# Patient Record
Sex: Male | Born: 1965 | Race: Black or African American | Hispanic: No | Marital: Single | Smoking: Current every day smoker
Health system: Southern US, Community
[De-identification: ages and names within clinical notes are randomized; demographics above are authoritative.]

---

## 2001-08-29 ENCOUNTER — Emergency Department (HOSPITAL_COMMUNITY): Admission: EM | Admit: 2001-08-29 | Discharge: 2001-08-29 | Payer: Self-pay | Admitting: Emergency Medicine

## 2003-02-11 ENCOUNTER — Emergency Department (HOSPITAL_COMMUNITY): Admission: EM | Admit: 2003-02-11 | Discharge: 2003-02-11 | Payer: Self-pay | Admitting: Emergency Medicine

## 2007-03-03 ENCOUNTER — Emergency Department (HOSPITAL_COMMUNITY): Admission: EM | Admit: 2007-03-03 | Discharge: 2007-03-04 | Payer: Self-pay | Admitting: *Deleted

## 2008-07-29 IMAGING — CR DG CHEST 2V
2 series · 2 of 2 positions shown · non-contrast
Comparison: none

CLINICAL DATA: Chest pain.  Tachycardia and palpitations.  
 CHEST - 2 VIEW:

[w chest pa]
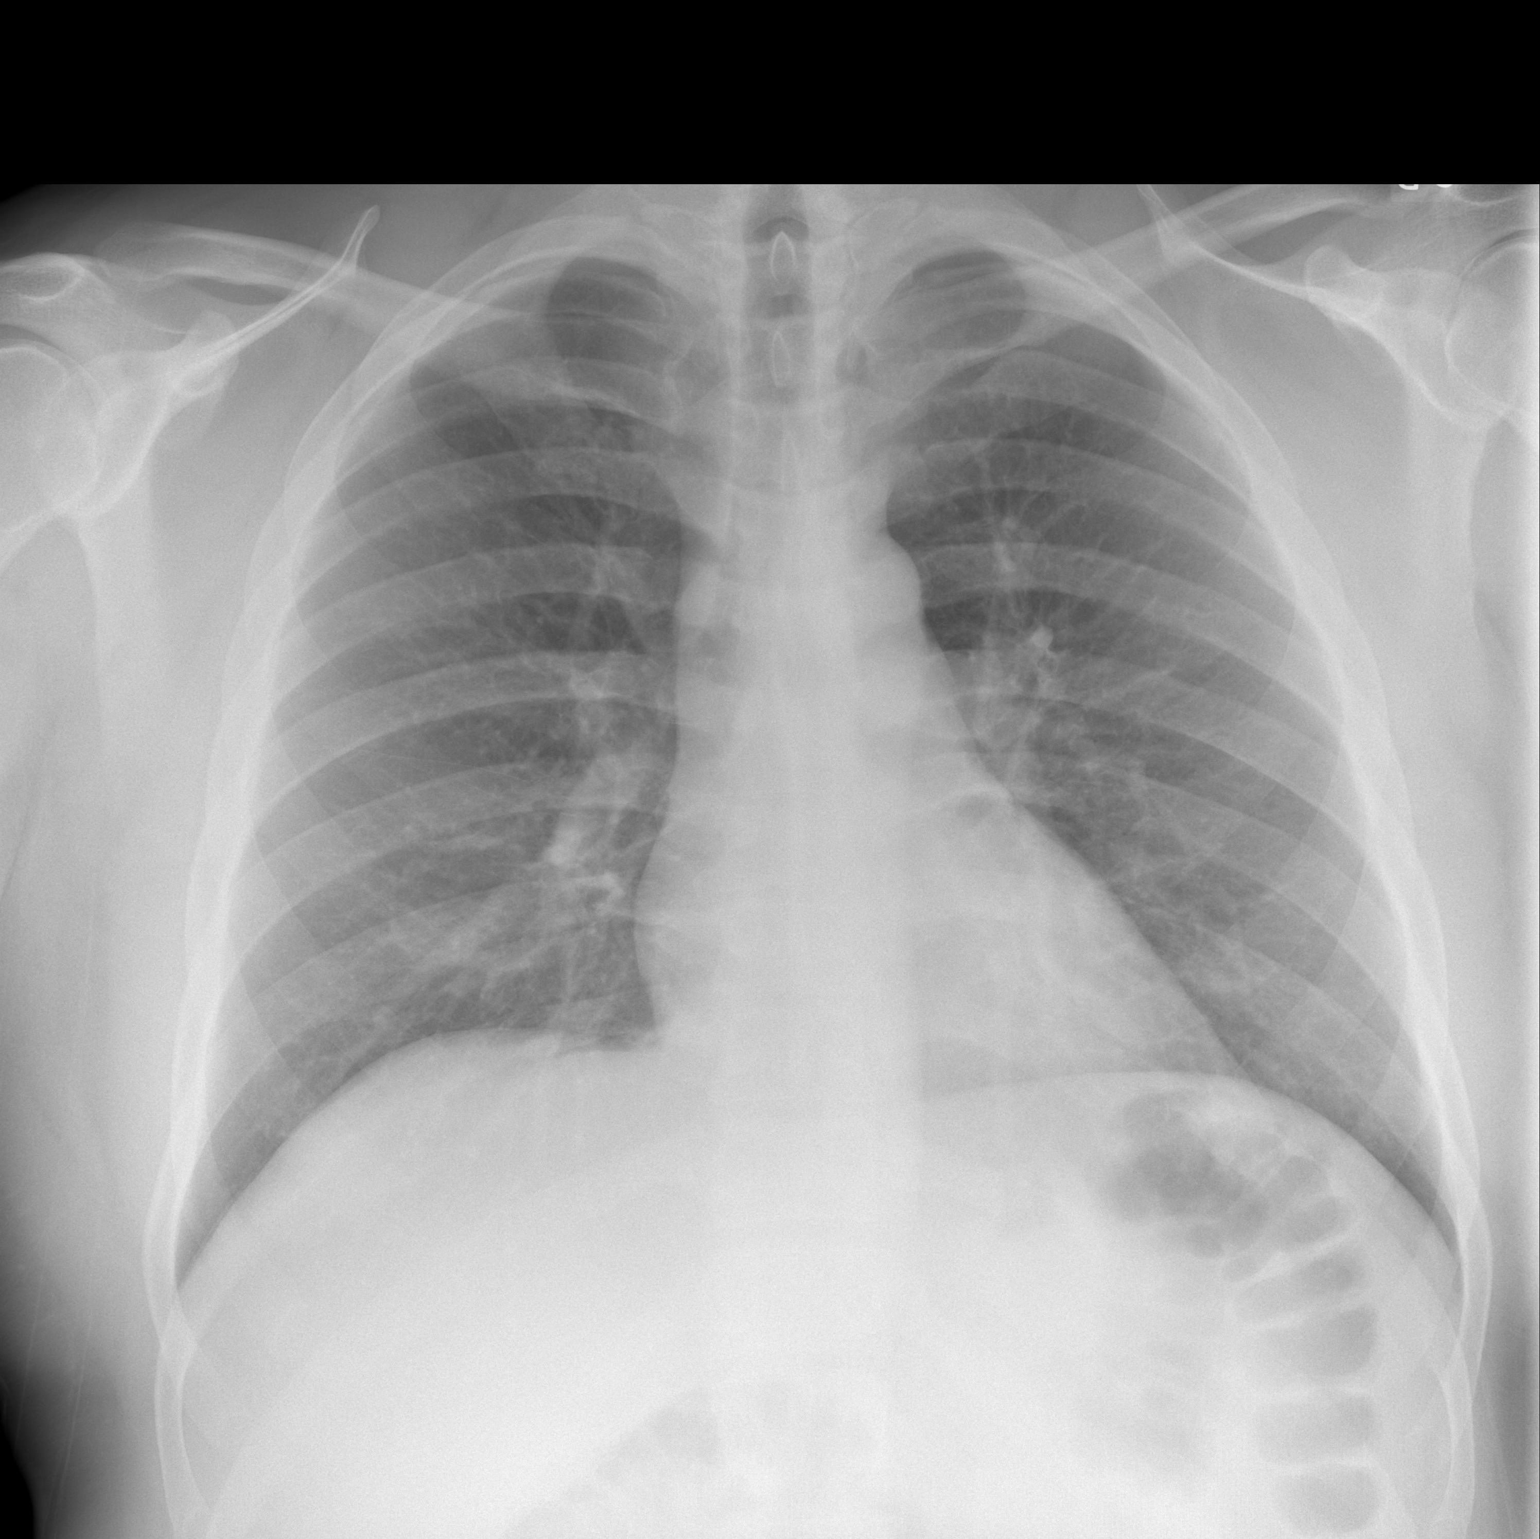

[w chest lat]
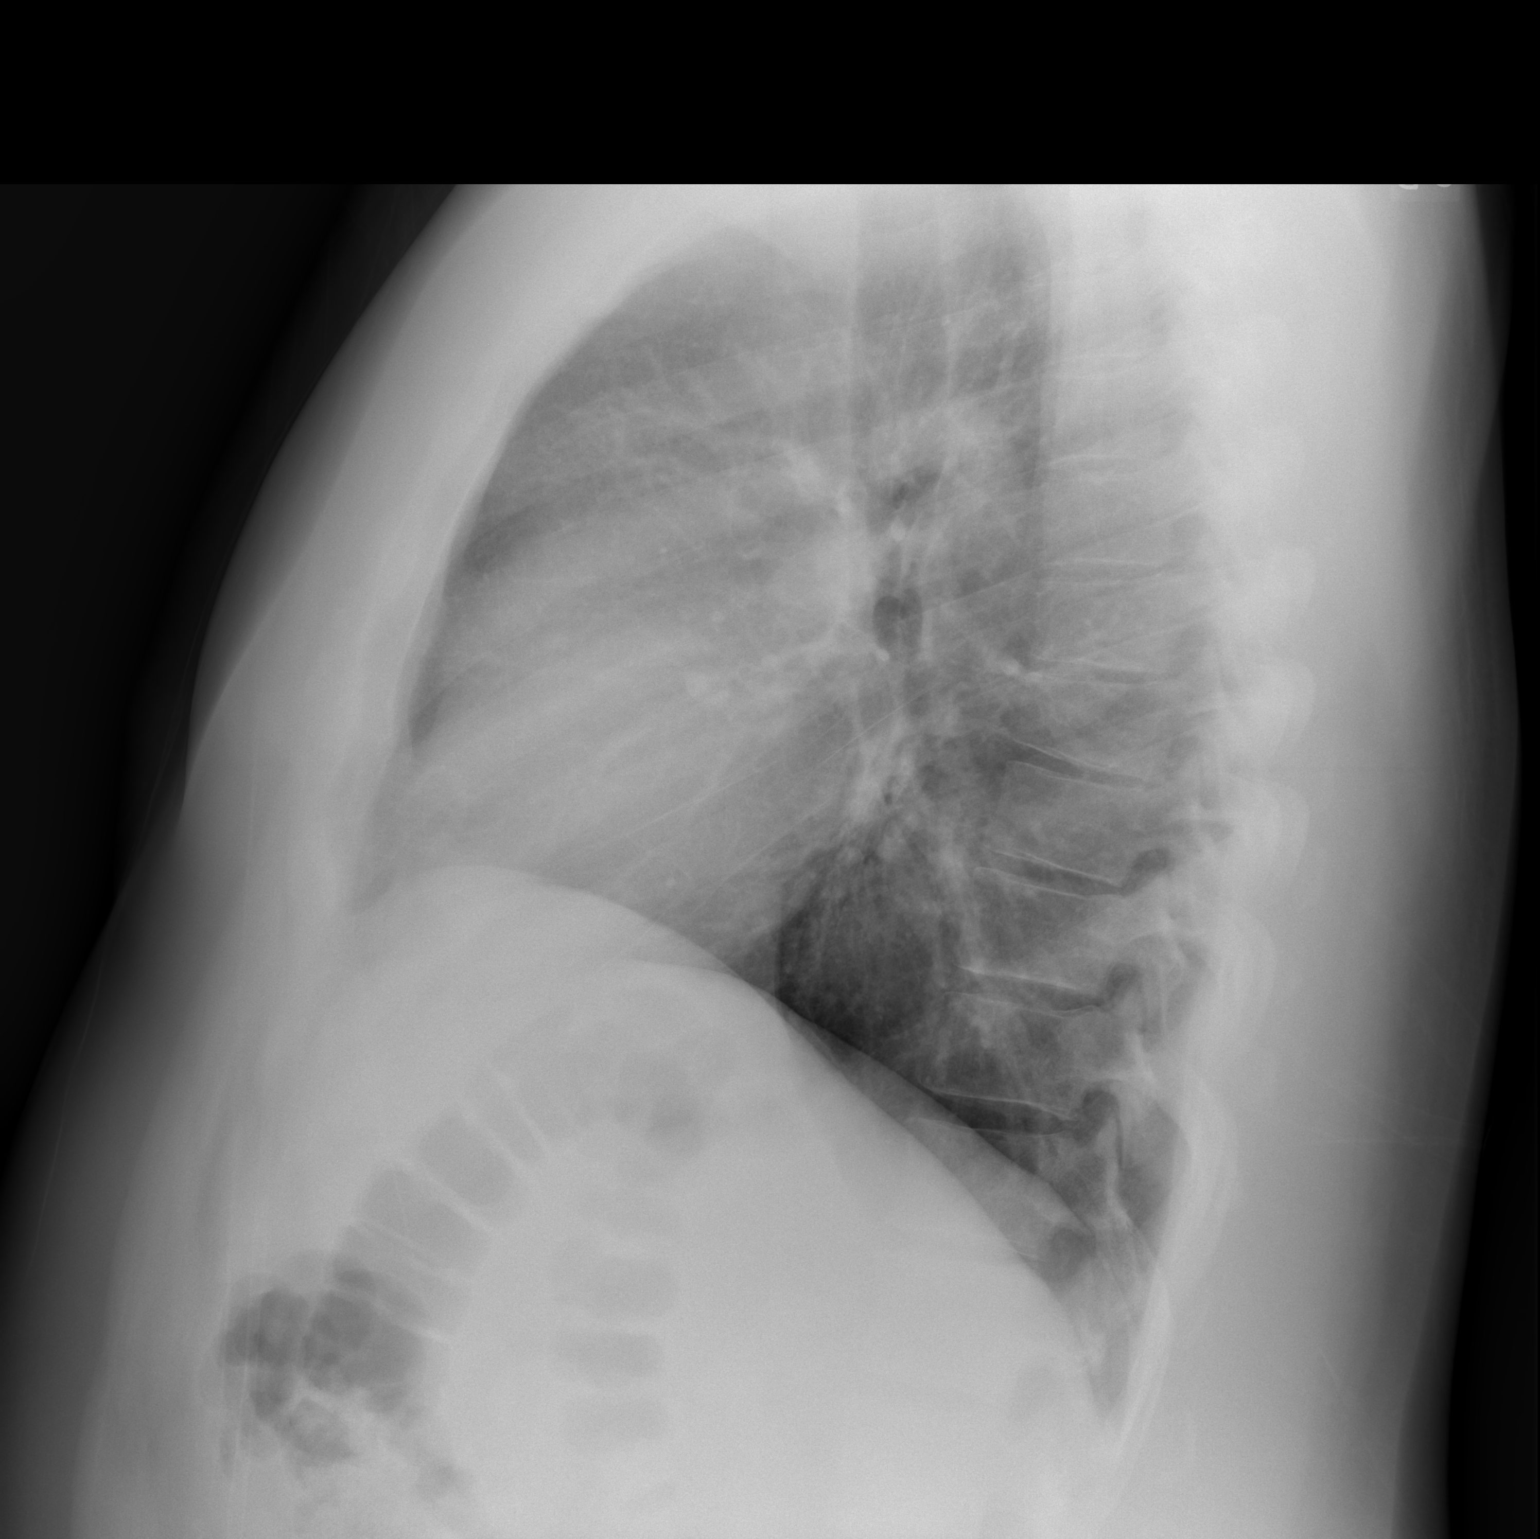

[2 of 2 positions shown; findings below may reference images not displayed]

FINDINGS: The heart size and mediastinal contours are within normal limits.  Both lungs are clear.  The visualized skeletal structures are unremarkable.
IMPRESSION: No active cardiopulmonary disease.

## 2010-07-02 NOTE — Discharge Summary (Signed)
NAMENASIRE, REALI NO.:  [id number]   MEDICAL RECORD NO.:  [id number]          PATIENT TYPE:  EMS   LOCATION:  ED                           FACILITY:  [facility]   PHYSICIAN:  Sheppard Penton. Stacie Acres, M.D.  DATE OF BIRTH:  October 10, 1965   DATE OF ADMISSION:  03/03/2007  DATE OF DISCHARGE:  03/04/2007                               DISCHARGE SUMMARY   CHIEF COMPLAINT:  Numbness, tingling and perhaps heart racing.   45 year old black male, who states about 6:30 this evening about three  to four hours ago after these episodes, he felt a tingling and numbness  all over his body. This lasted about three or four minutes, was followed  by a feeling that his heart was racing, but no chest pain, no chest  tightness. No headache. No visual problems. He said he felt very  nervous. It was uncertain whether he could speak or not. He tried to  reach his wife on the phone, but could not get through with the line.  The wife is in the room and gives a different account where she says he  could speak, he says he could not. I asked him specifically if he would  get his words out, he said yes.  He did lose control of his bowels or  his bladder. No weakness of the arms or  legs. No nausea or vomiting. No  sweating. No back pain. No other symptomatology.  He said he did have  some numbness about his  lips. He felt that his mouth was dry. No prior  history of the same. Had drank some caffeine over the day, but drinks  caffeine every day, this is not unusual. Does not think this made  symptoms worse or better. No chest pain at this time. Just states that  muscle was still tight all over his body. This has also resolved.   PAST MEDICAL HISTORY:  No known cardiopulmonary or GI disease.   SOCIAL HISTORY:  No drug abuse. He is a current smoker and he drinks  occasionally. He is married.   MEDICATIONS:  None.   ALLERGIES:  None.   FAMILY HISTORY:  No early heart disease.   REVIEW OF SYSTEMS:  Mild  obesity. Other than the above is negative.   PHYSICAL EXAMINATION:  VITAL SIGNS: Temperature 98.6. Pulse ox on room  air. 98%. BP 157/96, 144/90. Respirations 20. Pulse rate 98 to 105.  GENERAL: He is awake and alert in room 4A at [facility] and  affect checked but difficult due to age.  The wife is in the room with  him. His speech is slow at times and he is at times a poor historian.  HEENT: Pupils are equal, round and reactive. No nystagmus. Speech is  slow, but clear, hesitates occasionally with his sentences. Tries to  recall what happened. Oral mucosa is moist. No central cyanosis.  NECK: Is supple. No JVD. No bruits. Trachea is midline. No rigidity.  HEART: Has a regular rate and rhythm without rubs or gallops.  LUNGS: Are clear bilaterally without wheezing, rales or rhonchi.  CHEST: Chest wall nontender to palpation.  ABDOMEN: Is soft and nontender. No guarding or rebound. No peritoneal  signs.  SKIN: Is warm and dry with no rash or lesions.  EXTREMITIES: Symmetrical. No calf tenderness. No pitting edema.  NEUROLOGICAL: Alert and oriented x 4. Normal cognitive function. Gripes  are equal bilaterally. Able to hold either upper or lower extremities in  the hold up position for five or ten count. A nonfocal exam.   ED COURSE:  Labs and x-rays will be ordered.      Sheppard Penton. Stacie Acres, M.D.  Electronically Signed     NMM/MEDQ  D:  03/03/2007  T:  03/04/2007  Job:  [id number]

## 2010-07-02 NOTE — Discharge Summary (Signed)
NAMECLARION, Travis NO.:  [id number]   MEDICAL RECORD NO.:  [id number]          PATIENT TYPE:  EMS   LOCATION:  ED                           FACILITY:  [facility]   PHYSICIAN:  Sheppard Penton. Stacie Acres, M.D.  DATE OF BIRTH:  Apr 11, 1965   DATE OF ADMISSION:  03/03/2007  DATE OF DISCHARGE:  03/04/2007                               DISCHARGE SUMMARY   CHIEF COMPLAINT:  Numbness, tingling and perhaps heart racing.   45 year old black male, who states about 6:30 this evening about three  to four hours ago after these episodes, he felt a tingling and numbness  all over his body. This lasted about three or four minutes, was followed  by a feeling that his heart was racing, but no chest pain, no chest  tightness. No headache. No visual problems. He said he felt very  nervous. It was uncertain whether he could speak or not. He tried to  reach his wife on the phone, but could not get through with the line.  The wife is in the room and gives a different account where she says he  could speak, he says he could not. I asked him specifically if he would  get his words out, he said yes.  He did not lose control of his bowels  or his bladder. No weakness of the arms or  legs. No nausea or vomiting.  No sweating. No back pain. No other symptomatology.  He said he did have  some numbness about his  lips. He felt that his mouth was dry. No prior  history of the same. Had drank some caffeine over the day, but drinks  caffeine every day, this is not unusual. Does not think this made  symptoms worse or better. No chest pain at this time. Just states that  muscle was still tight all over his body. This has also resolved.   PAST MEDICAL HISTORY:  No known cardiopulmonary or GI disease.   SOCIAL HISTORY:  No drug abuse. He is a current smoker and he drinks  occasionally. He is married.   MEDICATIONS:  None.   ALLERGIES:  None.   FAMILY HISTORY:  No early heart disease.   REVIEW OF SYSTEMS:   Mild obesity. Other than the above is negative.   PHYSICAL EXAMINATION:  VITAL SIGNS: Temperature 98.6. Pulse ox on room  air. 98%. BP 157/96, 144/90. Respirations 20. Pulse rate 98 to 105.  GENERAL: He is awake and alert in room 4A at [facility] and  affect checked but difficult due to age.  The wife is in the room with  him. His speech is slow at times and he is at times a poor historian.  HEENT: Pupils are equal, round and reactive. No nystagmus. Speech is  slow, but clear, hesitates occasionally with his sentences. Tries to  recall what happened. Oral mucosa is moist. No central cyanosis.  NECK: Is supple. No JVD. No bruits. Trachea is midline. No rigidity.  HEART: Has a regular rate and rhythm without rubs or gallops.  LUNGS: Are clear bilaterally without wheezing, rales or  rhonchi.  CHEST: Chest wall nontender to palpation.  ABDOMEN: Is soft and nontender. No guarding or rebound. No peritoneal  signs.  SKIN: Is warm and dry with no rash or lesions.  EXTREMITIES: Symmetrical. No calf tenderness. No pitting edema.  NEUROLOGICAL: Alert and oriented x 4. Normal cognitive function. Grips  are equal bilaterally. Able to hold either upper or lower extremities in  the hold up position for five or ten count. A nonfocal exam.   ED COURSE:  Labs and x-rays will be ordered.      Sheppard Penton. Stacie Acres, M.D.  Electronically Signed     NMM/MEDQ  D:  03/03/2007  T:  03/10/2007  Job:  [id number]

## 2010-11-06 LAB — URINALYSIS, ROUTINE W REFLEX MICROSCOPIC
Hgb urine dipstick: NEGATIVE
Nitrite: NEGATIVE
Specific Gravity, Urine: 1.021
Urobilinogen, UA: 1

## 2010-11-06 LAB — BASIC METABOLIC PANEL
GFR calc non Af Amer: >60
Glucose, Bld: 119 — ABNORMAL HIGH
Potassium: 4.5
Sodium: 132 — ABNORMAL LOW

## 2010-11-06 LAB — RAPID URINE DRUG SCREEN, HOSP PERFORMED
Opiates: NOT DETECTED
Tetrahydrocannabinol: POSITIVE — AB

## 2010-11-06 LAB — CBC
Hemoglobin: 14.7
RBC: 4.94

## 2010-11-06 LAB — DIFFERENTIAL
Lymphocytes Relative: 15
Monocytes Absolute: 0.8
Monocytes Relative: 5
Neutro Abs: 13.8 — ABNORMAL HIGH

## 2010-11-07 LAB — POCT CARDIAC MARKERS
CKMB, poc: <1 — ABNORMAL LOW
Troponin i, poc: <0.05

## 2011-03-23 ENCOUNTER — Encounter (HOSPITAL_BASED_OUTPATIENT_CLINIC_OR_DEPARTMENT_OTHER): Payer: Self-pay | Admitting: *Deleted

## 2011-03-23 ENCOUNTER — Emergency Department (HOSPITAL_BASED_OUTPATIENT_CLINIC_OR_DEPARTMENT_OTHER)
Admission: EM | Admit: 2011-03-23 | Discharge: 2011-03-23 | Disposition: A | Discharge status: Home / Self Care | Payer: Self-pay | Attending: Emergency Medicine | Admitting: Emergency Medicine

## 2011-03-23 DIAGNOSIS — R609 Edema, unspecified: Secondary | ICD-10-CM | POA: Insufficient documentation

## 2011-03-23 DIAGNOSIS — J36 Peritonsillar abscess: Secondary | ICD-10-CM | POA: Insufficient documentation

## 2011-03-23 MED ORDER — AMOXICILLIN-POT CLAVULANATE 875-125 MG PO TABS: 1.0000 | ORAL_TABLET | Freq: Two times a day (BID) | ORAL | Status: AC

## 2011-03-23 NOTE — ED Notes (Signed)
Family at bedside. 

## 2011-03-23 NOTE — ED Notes (Signed)
MD at bedside. 

## 2011-03-23 NOTE — ED Notes (Signed)
Patient c/o bilateral ear pain and sore throat for the past four days, hurts to swallow, chills

## 2011-03-23 NOTE — ED Provider Notes (Signed)
History     CSN: [id number]  Arrival date & time 03/23/11  1013   First MD Initiated Contact with Patient 03/23/11 1020      Chief Complaint  Patient presents with  . Sore Throat    (Consider location/radiation/quality/duration/timing/severity/associated sxs/prior treatment) HPI Comments: Patient states the sore throat is been getting worse over the last 4 days he has had some increased pain more in the left side. Patient notes that it's very painful when he swallows. He is also having difficulty with speaking.  Pt states his voice is muffled.  He did take some old amoxicillin that he had but that has not helped.  Patient is a 46 y.o. male presenting with pharyngitis. The history is provided by the patient.  Sore Throat The current episode started more than 2 days ago. The problem occurs constantly. The problem has been gradually worsening. Pertinent negatives include no abdominal pain and no shortness of breath. The symptoms are aggravated by swallowing. The symptoms are relieved by nothing.    History reviewed. No pertinent past medical history.  History reviewed. No pertinent past surgical history.  No family history on file.  History  Substance Use Topics  . Smoking status: Current Everyday Smoker  . Smokeless tobacco: Not on file  . Alcohol Use: Yes      Review of Systems  Respiratory: Negative for shortness of breath.   Gastrointestinal: Negative for abdominal pain.  All other systems reviewed and are negative.    Allergies  Review of patient's allergies indicates no known allergies.  Home Medications  No current outpatient prescriptions on file.  BP 143/95  Pulse 107  Temp 100.1 F (37.8 C)  Resp 19  SpO2 98%  Physical Exam  Nursing note and vitals reviewed. Constitutional: He appears well-developed and well-nourished. No distress.  HENT:  Head: Normocephalic and atraumatic.  Right Ear: Tympanic membrane and external ear normal.  Left Ear: Tympanic  membrane and external ear normal.  Mouth/Throat: Posterior oropharyngeal edema, posterior oropharyngeal erythema and tonsillar abscesses (significant left-sided edema) present.  Eyes: Conjunctivae are normal. Right eye exhibits no discharge. Left eye exhibits no discharge. No scleral icterus.  Neck: Neck supple. No tracheal deviation present.  Cardiovascular: Normal rate.   Pulmonary/Chest: Effort normal. No stridor. No respiratory distress.  Musculoskeletal: He exhibits no edema.  Neurological: He is alert. Cranial nerve deficit: no gross deficits.  Skin: Skin is warm and dry. No rash noted.  Psychiatric: He has a normal mood and affect.    ED Course  Procedures (including critical care time)  Labs Reviewed - No data to display No results found.   1. Peritonsillar abscess       MDM  The patient's exam is concerning for a peritonsillar abscess. I've explained the findings to the patient. I consult with Dr. Pollyann Kennedy who is on-call for ENT. He will see the patient at the [facility] long emergency department. The patient is stable and I feel he can be transported by his own private vehicle. His wife will drive the car.        Celene Kras, MD 03/23/11 [phone]

## 2011-03-23 NOTE — ED Notes (Signed)
Vital signs stable. 

## 2011-03-23 NOTE — Consult Note (Signed)
  Reason for Consult:Sore Throat Referring Physician: ED  Travis Stevens is an 46 y.o. male.  HPI: 4 day history of sore throat. He has had similar episodes in the past. He took some Amoxicillin he had lying around but did not get any better. He is able to eat and drink. Denies breathing difficulty.  History reviewed. No pertinent past medical history.  History reviewed. No pertinent past surgical history.  No family history on file.  Social History:  reports that he has been smoking.  He does not have any smokeless tobacco history on file. He reports that he drinks alcohol. His drug history not on file.  Allergies: No Known Allergies  Medications: I have reviewed the patient's current medications.  No results found for this or any previous visit (from the past 48 hour(s)).  No results found.  ROS: Otherwise negative  Blood pressure 130/72, pulse 89, temperature 99.5 F (37.5 C), resp. rate 20, SpO2 99.00%.  PHYSICAL EXAM: Overall appearance:  Healthy appearing, in no distress Head:  Normocephalic, atraumatic. Ears: External auditory canals are clear; tympanic membranes are intact in the middle ears are free of any effusion. Nose: External nose is healthy in appearance. Internal nasal exam free of any lesions or obstruction. Oral Cavity:  He has a mildly "hot-potato" voice. There is no trismus. There is symmetric edema of the tonsils bilaterally, without any fullness or edema of the soft palate. There are no mucosal lesions or masses identified. Neuro:  No identifiable neurologic deficits. Neck: No palpable neck masses.  Studies Reviewed:none  Assessment/Plan: Acute tonsillitis, likely viral. No clinical evidence of peritonsillar abscess. Since he started Abx a couple days ago, we should complete a course. No reason to do Strep test at this point. Will change to Augmentin. He is instructed to call my office if he gets any worse. I urged him to try and stop  smoking.  Tammee Thielke H 03/23/2011, 12:57 PM

## 2011-03-23 NOTE — ED Notes (Signed)
Patient is resting comfortably. 

## 2015-05-20 ENCOUNTER — Emergency Department (HOSPITAL_COMMUNITY)
Admission: EM | Admit: 2015-05-20 | Discharge: 2015-05-20 | Disposition: A | Discharge status: Home / Self Care | Payer: Self-pay | Attending: Emergency Medicine | Admitting: Emergency Medicine

## 2015-05-20 ENCOUNTER — Encounter (HOSPITAL_COMMUNITY): Payer: Self-pay | Admitting: *Deleted

## 2015-05-20 DIAGNOSIS — F1410 Cocaine abuse, uncomplicated: Secondary | ICD-10-CM | POA: Insufficient documentation

## 2015-05-20 DIAGNOSIS — R000 Tachycardia, unspecified: Secondary | ICD-10-CM | POA: Insufficient documentation

## 2015-05-20 DIAGNOSIS — I10 Essential (primary) hypertension: Secondary | ICD-10-CM | POA: Insufficient documentation

## 2015-05-20 DIAGNOSIS — F17200 Nicotine dependence, unspecified, uncomplicated: Secondary | ICD-10-CM | POA: Insufficient documentation

## 2015-05-20 DIAGNOSIS — F1210 Cannabis abuse, uncomplicated: Secondary | ICD-10-CM | POA: Insufficient documentation

## 2015-05-20 DIAGNOSIS — F1110 Opioid abuse, uncomplicated: Secondary | ICD-10-CM | POA: Insufficient documentation

## 2015-05-20 DIAGNOSIS — F1910 Other psychoactive substance abuse, uncomplicated: Secondary | ICD-10-CM

## 2015-05-20 DIAGNOSIS — R4182 Altered mental status, unspecified: Secondary | ICD-10-CM | POA: Insufficient documentation

## 2015-05-20 LAB — COMPREHENSIVE METABOLIC PANEL
ALBUMIN: 4.5 g/dL (ref 3.5–5.0)
ALK PHOS: 72 U/L (ref 38–126)
ALT: 15 U/L — AB (ref 17–63)
AST: 18 U/L (ref 15–41)
Anion gap: 11 (ref 5–15)
BILIRUBIN TOTAL: 0.7 mg/dL (ref 0.3–1.2)
BUN: 14 mg/dL (ref 6–20)
CALCIUM: 9.3 mg/dL (ref 8.9–10.3)
CO2: 24 mmol/L (ref 22–32)
CREATININE: 1.57 mg/dL — AB (ref 0.61–1.24)
Chloride: 103 mmol/L (ref 101–111)
GFR calc Af Amer: 58 mL/min — ABNORMAL LOW (ref >60)
GFR calc non Af Amer: 50 mL/min — ABNORMAL LOW (ref >60)
GLUCOSE: 194 mg/dL — AB (ref 65–99)
POTASSIUM: 3.5 mmol/L (ref 3.5–5.1)
Sodium: 138 mmol/L (ref 135–145)
TOTAL PROTEIN: 7.9 g/dL (ref 6.5–8.1)

## 2015-05-20 LAB — CBC
HEMATOCRIT: 43.1 % (ref 39.0–52.0)
Hemoglobin: 14 g/dL (ref 13.0–17.0)
MCH: 28.4 pg (ref 26.0–34.0)
MCHC: 32.5 g/dL (ref 30.0–36.0)
MCV: 87.4 fL (ref 78.0–100.0)
PLATELETS: 440 10*3/uL — AB (ref 150–400)
RBC: 4.93 MIL/uL (ref 4.22–5.81)
RDW: 13.3 % (ref 11.5–15.5)
WBC: 21.5 10*3/uL — ABNORMAL HIGH (ref 4.0–10.5)

## 2015-05-20 LAB — RAPID URINE DRUG SCREEN, HOSP PERFORMED
Amphetamines: NOT DETECTED
Barbiturates: NOT DETECTED
Benzodiazepines: NOT DETECTED
Cocaine: POSITIVE — AB
Opiates: POSITIVE — AB
Tetrahydrocannabinol: POSITIVE — AB

## 2015-05-20 LAB — CBG MONITORING, ED: Glucose-Capillary: 166 mg/dL — ABNORMAL HIGH (ref 65–99)

## 2015-05-20 LAB — ETHANOL

## 2015-05-20 MED ORDER — SODIUM CHLORIDE 0.9 % IV BOLUS (SEPSIS)
1000.0000 mL | Freq: Once | INTRAVENOUS | Status: AC
  Administered 2015-05-20: 1000 mL via INTRAVENOUS

## 2015-05-20 MED ORDER — NALOXONE HCL 0.4 MG/ML IJ SOLN
0.4000 mg | Freq: Once | INTRAMUSCULAR | Status: AC
  Administered 2015-05-20: 0.4 mg via INTRAVENOUS
  Filled 2015-05-20: qty 1

## 2015-05-20 MED ORDER — SODIUM CHLORIDE 0.9 % IV BOLUS (SEPSIS): 1000.0000 mL | Freq: Once | INTRAVENOUS | Status: DC

## 2015-05-20 MED ORDER — ONDANSETRON HCL 4 MG/2ML IJ SOLN
4.0000 mg | Freq: Once | INTRAMUSCULAR | Status: AC
  Administered 2015-05-20: 4 mg via INTRAVENOUS
  Filled 2015-05-20: qty 2

## 2015-05-20 NOTE — ED Notes (Signed)
Awake and alert immediately after receiving narcan.   Family at the bedside.

## 2015-05-20 NOTE — ED Provider Notes (Signed)
CSN: [id number]     Arrival date & time 05/20/15  0246 History   First MD Initiated Contact with Patient 05/20/15 0410     Chief Complaint  Patient presents with  . Altered Mental Status     (Consider location/radiation/quality/duration/timing/severity/associated sxs/prior Treatment) HPI  A LEVEL 5 CAVEAT PERTAINS DUE TO ALTERED MENTAL STATUS Pt states he was "partying" tonight- endorses using cocaine- does not know of any other substances.  His daughter states she was called after another person in the home was found unresponsive.  This patient had altered mental status.  Per girlfriend there was etoh involved.    History reviewed. No pertinent past medical history. History reviewed. No pertinent past surgical history. No family history on file. Social History  Substance Use Topics  . Smoking status: Current Every Day Smoker  . Smokeless tobacco: None  . Alcohol Use: Yes    Review of Systems  UNABLE TO OBTAIN ROS DUE TO LEVEL 5 CAVEAT    Allergies  Review of patient's allergies indicates no known allergies.  Home Medications   Prior to Admission medications   Medication Sig Start Date End Date Taking? Authorizing Provider  ibuprofen (ADVIL,MOTRIN) 200 MG tablet Take 200 mg by mouth every 8 (eight) hours as needed. For pain.   Yes Historical Provider, MD   BP 133/81 mmHg  Pulse 111  Temp(Src) 98.4 F (36.9 C) (Oral)  Resp 11  SpO2 95%  Vitals reviewed Physical Exam  Physical Examination: General appearance - decreased mental status, sleeping and snoring but easily arousable, confused when awake,  and in no distress Mental status - alert, oriented to person, place, and time Head- NCAT Eyes - no conjunctival injection no scleral icterus Mouth - mucous membranes moist, pharynx normal without lesions Chest - clear to auscultation, no wheezes, rales or rhonchi, symmetric air entry Heart - normal rate, regular rhythm, normal S1, S2, no murmurs, rubs, clicks or  gallops Abdomen - soft, nontender, nondistended, no masses or organomegaly Neurological - somnolent, confused when awakened, moving all extremities, oriented x 2- not aware of time Extremities - peripheral pulses normal, no pedal edema, no clubbing or cyanosis Skin - normal coloration and turgor, no rashes  ED Course  Procedures (including critical care time) Labs Review Labs Reviewed  COMPREHENSIVE METABOLIC PANEL - Abnormal; Notable for the following:    Glucose, Bld 194 (*)    Creatinine, Ser 1.57 (*)    ALT 15 (*)    GFR calc non Af Amer 50 (*)    GFR calc Af Amer 58 (*)    All other components within normal limits  CBC - Abnormal; Notable for the following:    WBC 21.5 (*)    Platelets 440 (*)    All other components within normal limits  CBG MONITORING, ED - Abnormal; Notable for the following:    Glucose-Capillary 166 (*)    All other components within normal limits  ETHANOL  URINE RAPID DRUG SCREEN, HOSP PERFORMED    Imaging Review No results found. I have personally reviewed and evaluated these images and lab results as part of my medical decision-making.   EKG Interpretation   Date/Time:  Sunday May 20 2015 02:57:35 EDT Ventricular Rate:  133 PR Interval:  150 QRS Duration: 78 QT Interval:  296 QTC Calculation: 440 R Axis:   -7 Text Interpretation:  Sinus tachycardia Otherwise normal ECG Since  previous tracing rate faster Confirmed by Karma GanjaLINKER  MD, Rayshawn Visconti [phone]([id number]) on  05/20/2015 6:11:33 AM  MDM   Final diagnoses:  Altered mental status, unspecified altered mental status type  Polysubstance abuse  Tachycardia  Essential hypertension    Pt presenting with altered mental status after partying tonight. He is tachycardic and hypertensive which is c/w cocaine use.  He is somnolent with small pupils- concern for opiate- will give narcan, IV fluids and reassess.    4:42 AM pt had good response to narcan, continues to be tachycardic and hypertensive-  endorses using cocaine and marijuana.  After waking up after narcan states there was heroin also.    6:24 AM HR has come down to 115, however now patient is vomiting- given zofran and will continue to monitor.    7:53 AM on recheck patient sleeping but easily arousable- HR 104, SBP 129- pt states he is feeling much better.  HR increases to 120s upon awakening and talking.  Will continue to monitor.    8:17 AM pt signed out to Dr. Manus Gunning pending reasessment after patient sobers up more and continued vital sign monitoring.     Jerelyn Scott, MD 05/20/15 [phone]

## 2015-05-20 NOTE — ED Notes (Signed)
Provided PT with Water, no issues.

## 2015-05-20 NOTE — Discharge Instructions (Signed)
Polysubstance Abuse Stop using drugs or alcohol. Follow up with your doctor. Return to the ED if you develop new or worsening symptoms. When people abuse more than one drug or type of drug it is called polysubstance or polydrug abuse. For example, many smokers also drink alcohol. This is one form of polydrug abuse. Polydrug abuse also refers to the use of a drug to counteract an unpleasant effect produced by another drug. It may also be used to help with withdrawal from another drug. People who take stimulants may become agitated. Sometimes this agitation is countered with a tranquilizer. This helps protect against the unpleasant side effects. Polydrug abuse also refers to the use of different drugs at the same time.  Anytime drug use is interfering with normal living activities, it has become abuse. This includes problems with family and friends. Psychological dependence has developed when your mind tells you that the drug is needed. This is usually followed by physical dependence which has developed when continuing increases of drug are required to get the same feeling or "high". This is known as addiction or chemical dependency. A person's risk is much higher if there is a history of chemical dependency in the family. SIGNS OF CHEMICAL DEPENDENCY  You have been told by friends or family that drugs have become a problem.  You fight when using drugs.  You are having blackouts (not remembering what you do while using).  You feel sick from using drugs but continue using.  You lie about use or amounts of drugs (chemicals) used.  You need chemicals to get you going.  You are suffering in work performance or in school because of drug use.  You get sick from use of drugs but continue to use anyway.  You need drugs to relate to people or feel comfortable in social situations.  You use drugs to forget problems. "Yes" answered to any of the above signs of chemical dependency indicates there are  problems. The longer the use of drugs continues, the greater the problems will become. If there is a family history of drug or alcohol use, it is best not to experiment with these drugs. Continual use leads to tolerance. After tolerance develops more of the drug is needed to get the same feeling. This is followed by addiction. With addiction, drugs become the most important part of life. It becomes more important to take drugs than participate in the other usual activities of life. This includes relating to friends and family. Addiction is followed by dependency. Dependency is a condition where drugs are now needed not just to get high, but to feel normal. Addiction cannot be cured but it can be stopped. This often requires outside help and the care of professionals. Treatment centers are listed in the yellow pages under: Cocaine, Narcotics, and Alcoholics Anonymous. Most hospitals and clinics can refer you to a specialized care center. Talk to your caregiver if you need help.   This information is not intended to replace advice given to you by your health care provider. Make sure you discuss any questions you have with your health care provider.   Document Released: 09/25/2004 Document Revised: 04/28/2011 Document Reviewed: 02/08/2014 Elsevier Interactive Patient Education [employer]2016 Elsevier Inc.

## 2015-05-20 NOTE — ED Notes (Signed)
Pt verbalizes understanding of instructions. Ambulates easily.

## 2015-05-20 NOTE — ED Provider Notes (Signed)
Care is assumed from Dr. Karma GanjaLinker. Patient came in with altered mental status after using cocaine and heroin last night. He is hypertensive and tachycardic.  He is now awake and alert. He is tolerating by mouth and ambulatory. He denies any complaints. No head, neck, chest, back or abdominal pain. No suicidal or homicidal thoughts.   Drug screen positive for opiates, cocaine, THC. Patient states he feels fine. No chest pain or shortness of breath. Heart rate and blood pressure have improved. Suspect persistent tachycardia due to cocaine use. He states he also feels nervous around hospitals.  Advised to discontinue use of cocaine. Patient transferred to be discharged. Return precautions discussed.  Glynn OctaveStephen Chereese Cilento, MD 05/20/15 [phone]

## 2015-05-20 NOTE — ED Notes (Signed)
Okay walking between POD A and Pod B

## 2015-05-20 NOTE — ED Notes (Signed)
Pt was at girlfriend's hour and she called his daughter saying he wasn't responding.  They said when they arrived to the house there was another person on the floor foaming at the mouth and it was reported.  Pt is talking and there is etoh on board.  Denies drug use.

## 2015-08-27 ENCOUNTER — Emergency Department (HOSPITAL_COMMUNITY)
Admission: EM | Admit: 2015-08-27 | Discharge: 2015-08-27 | Disposition: A | Discharge status: Home / Self Care | Payer: Self-pay | Attending: Physician Assistant | Admitting: Physician Assistant

## 2015-08-27 ENCOUNTER — Encounter (HOSPITAL_COMMUNITY): Payer: Self-pay | Admitting: *Deleted

## 2015-08-27 DIAGNOSIS — J029 Acute pharyngitis, unspecified: Secondary | ICD-10-CM | POA: Insufficient documentation

## 2015-08-27 DIAGNOSIS — J020 Streptococcal pharyngitis: Secondary | ICD-10-CM

## 2015-08-27 DIAGNOSIS — F17200 Nicotine dependence, unspecified, uncomplicated: Secondary | ICD-10-CM | POA: Insufficient documentation

## 2015-08-27 LAB — COMPREHENSIVE METABOLIC PANEL
ALK PHOS: 94 U/L (ref 38–126)
ALT: 20 U/L (ref 17–63)
ANION GAP: 11 (ref 5–15)
AST: 20 U/L (ref 15–41)
Albumin: 3.8 g/dL (ref 3.5–5.0)
BILIRUBIN TOTAL: 0.7 mg/dL (ref 0.3–1.2)
BUN: 12 mg/dL (ref 6–20)
CHLORIDE: 99 mmol/L — AB (ref 101–111)
CO2: 23 mmol/L (ref 22–32)
Calcium: 9.4 mg/dL (ref 8.9–10.3)
Creatinine, Ser: 1.07 mg/dL (ref 0.61–1.24)
GFR calc Af Amer: >60 mL/min (ref >60)
GFR calc non Af Amer: >60 mL/min (ref >60)
GLUCOSE: 104 mg/dL — AB (ref 65–99)
POTASSIUM: 4 mmol/L (ref 3.5–5.1)
Sodium: 133 mmol/L — ABNORMAL LOW (ref 135–145)
Total Protein: 8.2 g/dL — ABNORMAL HIGH (ref 6.5–8.1)

## 2015-08-27 LAB — CBC WITH DIFFERENTIAL/PLATELET
BASOS PCT: 0 %
Basophils Absolute: 0 10*3/uL (ref 0.0–0.1)
EOS PCT: 0 %
Eosinophils Absolute: 0 10*3/uL (ref 0.0–0.7)
HEMATOCRIT: 42.7 % (ref 39.0–52.0)
HEMOGLOBIN: 14.3 g/dL (ref 13.0–17.0)
Lymphocytes Relative: 9 %
Lymphs Abs: 2.9 10*3/uL (ref 0.7–4.0)
MCH: 28.8 pg (ref 26.0–34.0)
MCHC: 33.5 g/dL (ref 30.0–36.0)
MCV: 86.1 fL (ref 78.0–100.0)
MONOS PCT: 10 %
Monocytes Absolute: 3.2 10*3/uL — ABNORMAL HIGH (ref 0.1–1.0)
NEUTROS PCT: 81 %
Neutro Abs: 26.3 10*3/uL — ABNORMAL HIGH (ref 1.7–7.7)
Platelets: 334 10*3/uL (ref 150–400)
RBC: 4.96 MIL/uL (ref 4.22–5.81)
RDW: 13.2 % (ref 11.5–15.5)
WBC: 32.4 10*3/uL — ABNORMAL HIGH (ref 4.0–10.5)

## 2015-08-27 LAB — MONONUCLEOSIS SCREEN: Mono Screen: NEGATIVE

## 2015-08-27 LAB — RAPID STREP SCREEN (MED CTR MEBANE ONLY): Streptococcus, Group A Screen (Direct): POSITIVE — AB

## 2015-08-27 MED ORDER — SODIUM CHLORIDE 0.9 % IV BOLUS (SEPSIS)
1000.0000 mL | Freq: Once | INTRAVENOUS | Status: AC
  Administered 2015-08-27: 1000 mL via INTRAVENOUS

## 2015-08-27 MED ORDER — IBUPROFEN 100 MG/5ML PO SUSP
600.0000 mg | Freq: Once | ORAL | Status: AC
  Administered 2015-08-27: 200 mg via ORAL
  Filled 2015-08-27: qty 30

## 2015-08-27 MED ORDER — DEXAMETHASONE SODIUM PHOSPHATE 10 MG/ML IJ SOLN
10.0000 mg | Freq: Once | INTRAMUSCULAR | Status: AC
  Administered 2015-08-27: 10 mg via INTRAVENOUS
  Filled 2015-08-27: qty 1

## 2015-08-27 MED ORDER — PENICILLIN G BENZATHINE 1200000 UNIT/2ML IM SUSP
1.2000 10*6.[IU] | Freq: Once | INTRAMUSCULAR | Status: AC
  Administered 2015-08-27: 1.2 10*6.[IU] via INTRAMUSCULAR
  Filled 2015-08-27: qty 2

## 2015-08-27 NOTE — Discharge Instructions (Signed)
Pharyngitis °Pharyngitis is redness, pain, and swelling (inflammation) of your pharynx.  °CAUSES  °Pharyngitis is usually caused by infection. Most of the time, these infections are from viruses (viral) and are part of a cold. However, sometimes pharyngitis is caused by bacteria (bacterial). Pharyngitis can also be caused by allergies. Viral pharyngitis may be spread from person to person by coughing, sneezing, and personal items or utensils (cups, forks, spoons, toothbrushes). Bacterial pharyngitis may be spread from person to person by more intimate contact, such as kissing.  °SIGNS AND SYMPTOMS  °Symptoms of pharyngitis include:   °· Sore throat.   °· Tiredness (fatigue).   °· Low-grade fever.   °· Headache. °· Joint pain and muscle aches. °· Skin rashes. °· Swollen lymph nodes. °· Plaque-like film on throat or tonsils (often seen with bacterial pharyngitis). °DIAGNOSIS  °Your health care provider will ask you questions about your illness and your symptoms. Your medical history, along with a physical exam, is often all that is needed to diagnose pharyngitis. Sometimes, a rapid strep test is done. Other lab tests may also be done, depending on the suspected cause.  °TREATMENT  °Viral pharyngitis will usually get better in 3-4 days without the use of medicine. Bacterial pharyngitis is treated with medicines that kill germs (antibiotics).  °HOME CARE INSTRUCTIONS  °· Drink enough water and fluids to keep your urine clear or pale yellow.   °· Only take over-the-counter or prescription medicines as directed by your health care provider:   °· If you are prescribed antibiotics, make sure you finish them even if you start to feel better.   °· Do not take aspirin.   °· Get lots of rest.   °· Gargle with 8 oz of salt water (½ tsp of salt per 1 qt of water) as often as every 1-2 hours to soothe your throat.   °· Throat lozenges (if you are not at risk for choking) or sprays may be used to soothe your throat. °SEEK MEDICAL  CARE IF:  °· You have large, tender lumps in your neck. °· You have a rash. °· You cough up green, yellow-brown, or bloody spit. °SEEK IMMEDIATE MEDICAL CARE IF:  °· Your neck becomes stiff. °· You drool or are unable to swallow liquids. °· You vomit or are unable to keep medicines or liquids down. °· You have severe pain that does not go away with the use of recommended medicines. °· You have trouble breathing (not caused by a stuffy nose). °MAKE SURE YOU:  °· Understand these instructions. °· Will watch your condition. °· Will get help right away if you are not doing well or get worse. °  °This information is not intended to replace advice given to you by your health care provider. Make sure you discuss any questions you have with your health care provider. °  °Document Released: 02/03/2005 Document Revised: 11/24/2012 Document Reviewed: 10/11/2012 °Elsevier Interactive Patient Education ©2016 [employer] ° °Rapid Strep Test °Strep throat is a bacterial infection caused by the bacteria Streptococcus pyogenes. A rapid strep test is the quickest way to check if these bacteria are causing your sore throat. The test can be done at your health care provider's office. Results are usually ready in 10-20 minutes. °You may have this test if you have symptoms of strep throat. These include:  °· A red throat with yellow or white spots. °· Neck swelling and tenderness. °· Fever. °· Loss of appetite. °· Trouble breathing or swallowing. °· Rash. °· Dehydration. °This test requires a sample of fluid from the   back of your throat and tonsils. Your health care provider may hold down your tongue with a tongue depressor and use a swab to collect the sample.  °Your health care provider may collect a second sample at the same time. The second sample may be used for a throat culture. In a culture test, the sample is combined with a substance that encourages bacteria to grow. It takes longer to get the results of the throat culture  test, but they are more accurate. They can confirm the results from a rapid strep test, or show that those results were wrong. °RESULTS  °It is your responsibility to obtain your test results. Ask the lab or department performing the test when and how you will get your results. Contact your health care provider to discuss any questions you have about your results.  °The results of the rapid strep test will be negative or positive.  °Meaning of Negative Test Results °If the result of your rapid strep test is negative, then it means:  °· It is likely that you do not have strep throat. °· A virus may be causing your sore throat. °Your health care provider may do a throat culture to confirm the results of the rapid strep test. The throat culture can also identify the different strains of strep bacteria. °Meaning of Positive Test Results °If the result of your rapid strep test is positive, then it means: °· It is likely that you do have strep throat. °· You may have to take antibiotics. °Your health care provider may do a throat culture to confirm the results of the rapid strep test. Strep throat usually requires a course of antibiotics.  °  °This information is not intended to replace advice given to you by your health care provider. Make sure you discuss any questions you have with your health care provider. °  °Document Released: 03/13/2004 Document Revised: 02/24/2014 Document Reviewed: 05/12/2013 °Elsevier Interactive Patient Education ©2016 [employer] ° °

## 2015-08-27 NOTE — ED Notes (Signed)
Pt is here with throat swelling and has muffled voice. States he has a lot of pain and hard to swallow

## 2015-08-27 NOTE — ED Provider Notes (Signed)
CSN: [id number]     Arrival date & time 08/27/15  1759 History   First MD Initiated Contact with Patient 08/27/15 1935     Chief Complaint  Patient presents with  . Oral Swelling     (Consider location/radiation/quality/duration/timing/severity/associated sxs/prior Treatment) HPI   Patient's very pleasant African American 50 year old male presenting with a sore throat. Patient's had a sore throat for the last 3-4 days. He reports that has been getting worse and has a muffled voice now. He reports that he feels fatigued, has not noticed his fever. Patient able to handle secretions, but pain with swallowing. Patient had no sick contacts.  Patient is around children.  History reviewed. No pertinent past medical history. History reviewed. No pertinent past surgical history. No family history on file. Social History  Substance Use Topics  . Smoking status: Current Every Day Smoker  . Smokeless tobacco: None  . Alcohol Use: Yes    Review of Systems  Constitutional: Negative for fever and activity change.  HENT: Positive for sore throat and trouble swallowing. Negative for ear pain and mouth sores.   Eyes: Negative for pain and visual disturbance.  Respiratory: Negative for apnea, cough, chest tightness and shortness of breath.   Cardiovascular: Negative for chest pain.  Gastrointestinal: Negative for nausea, vomiting and diarrhea.  Genitourinary: Negative for dysuria and frequency.  Musculoskeletal: Negative for back pain and gait problem.  Skin: Negative for rash.  Allergic/Immunologic: Negative for immunocompromised state.  Neurological: Negative for dizziness and light-headedness.  Psychiatric/Behavioral: Negative for behavioral problems and agitation.  All other systems reviewed and are negative.     Allergies  Review of patient's allergies indicates no known allergies.  Home Medications   Prior to Admission medications   Medication Sig Start Date End Date Taking?  Authorizing Provider  ibuprofen (ADVIL,MOTRIN) 200 MG tablet Take 200 mg by mouth every 8 (eight) hours as needed. For pain.    Historical Provider, MD   BP 153/93 mmHg  Pulse 113  Temp(Src) 101.4 F (38.6 C) (Oral)  Resp 20  SpO2 99% Physical Exam  Constitutional: He is oriented to person, place, and time. He appears well-nourished.  HENT:  Head: Normocephalic.  Mouth/Throat: Oropharynx is clear and moist.  Patient is swelling to bilateral throat, mild adenopathy.  Eyes: Conjunctivae are normal.  Neck: No tracheal deviation present.  Cardiovascular: Normal rate.   Pulmonary/Chest: Effort normal. No stridor. No respiratory distress.  Musculoskeletal: Normal range of motion.  Neurological: He is oriented to person, place, and time. No cranial nerve deficit.  Skin: Skin is warm and dry. No rash noted. He is not diaphoretic.  Psychiatric: He has a normal mood and affect. His behavior is normal.  Nursing note and vitals reviewed.   ED Course  Procedures (including critical care time) Labs Review Labs Reviewed  RAPID STREP SCREEN (NOT AT [facility])  MONONUCLEOSIS SCREEN  CBC WITH DIFFERENTIAL/PLATELET  COMPREHENSIVE METABOLIC PANEL    Imaging Review No results found. I have personally reviewed and evaluated these images and lab results as part of my medical decision-making.   EKG Interpretation None      MDM   Final diagnoses:  None   Patient is a 50 year old male presenting with sore throat. Patient is swelling to the bilateral back of his throat. Differential includes mono, strep throat, bilateral peritonsillar abscess. We will get mono and strep test.   Patient strep positive. Given dex and penicillin. We'll have him follow-up with PCP and told to return if any,  concerns. Patient able to take by mouth, small sips. Anticipate this getting better as his temperature decreases, dexamethasone becomes active. Has never had any problems tolerating secretions.  Courteney Randall An, MD 08/27/15 2118

## 2015-08-27 NOTE — ED Notes (Signed)
Called patient to move to room, unable to locate patient at this time. 

## 2015-08-30 ENCOUNTER — Encounter (HOSPITAL_COMMUNITY): Payer: Self-pay

## 2015-08-30 ENCOUNTER — Emergency Department (HOSPITAL_COMMUNITY)
Admission: EM | Admit: 2015-08-30 | Discharge: 2015-08-30 | Disposition: A | Discharge status: Home / Self Care | Payer: Self-pay | Attending: Emergency Medicine | Admitting: Emergency Medicine

## 2015-08-30 DIAGNOSIS — F17200 Nicotine dependence, unspecified, uncomplicated: Secondary | ICD-10-CM | POA: Insufficient documentation

## 2015-08-30 DIAGNOSIS — J36 Peritonsillar abscess: Secondary | ICD-10-CM | POA: Insufficient documentation

## 2015-08-30 NOTE — ED Notes (Signed)
Pt presents with worsening pain and swelling to throat since visit here on Monday.  Pt diagnosed with strep, received abx injection, reports x 2 days, pt has had muffled voice, unable to eat or drink.

## 2015-08-30 NOTE — Discharge Instructions (Signed)
ENT specialist Dr. Lazarus SalinesWolicki to see and provide further recommendations.

## 2015-08-30 NOTE — ED Provider Notes (Signed)
Patient seen and evaluated. Also seen with Dr. Sampson GoonFitzgerald. Patient complains of sore throat for proximal he 7 days. Seen here 3 days ago and given IM penicillin, and Decadron. He states shortly thereafter he developed worsening left-sided pain. He now has a typical "hot potato" voice. He states is quite painful swallow. Has continued with fever as well. Febrile today. On exam he has little to no trismus. He has clinically obvious left peritonsillar abscess with convexity of the peritonsillar tissues with bulging. Uvula still midline. Right side normal. He has no induration of the tissues of the neck or obvious tissue swelling.  I discussed the case with Dr. Leretha DykesK. Woliicki. He requests the patient present directly to his office, which is across the street   I think this is a reasonable request. Patient is protecting his airway well and not compromise. He will go directly Dr. Raye SorrowWolicki's office across the street for incision and drainage.  Rolland PorterMark Genevive Printup, MD 08/30/15 [phone]

## 2015-08-30 NOTE — ED Provider Notes (Signed)
CSN: [id number]     Arrival date & time 08/30/15  0747 History   First MD Initiated Contact with Patient 08/30/15 0751     No chief complaint on file.  (Consider location/radiation/quality/duration/timing/severity/associated sxs/prior Treatment) HPI  Travis Stevens is a 50-y/o male with history of recurrent sore throat who presents with worsening sore throat and dysphagia and inability to tolerate PO. Pt's daughter helped provide much of history. Symptoms began about a week ago, and patient was evaluated in the ED 08/27/15. He had a positive rapid strep test and was given dexamethasone and IM penicillin. Since that time, patient developed left-sided throat pain and muffled voice. He has only been able to take small amounts of liquids. Daughter notes he has complained of left ear pain and some drooling. He has taken some crushed ibuprofen/tylenol with little improvement. He denies fevers or chills.   History reviewed. No pertinent past medical history. History reviewed. No pertinent past surgical history. History reviewed. No pertinent family history. Social History  Substance Use Topics  . Smoking status: Current Every Day Smoker  . Smokeless tobacco: None  . Alcohol Use: Yes    Review of Systems  Constitutional: Positive for fatigue. Negative for fever and chills.  HENT: Positive for drooling and ear pain.   Respiratory: Negative for shortness of breath.   Cardiovascular: Negative for chest pain.  Gastrointestinal: Positive for nausea. Negative for vomiting, abdominal pain and diarrhea.  Musculoskeletal: Negative for myalgias and neck stiffness.  Neurological: Positive for headaches. Negative for numbness.    Allergies  Review of patient's allergies indicates no known allergies.  Home Medications   Prior to Admission medications   Medication Sig Start Date End Date Taking? Authorizing Provider  ibuprofen (ADVIL,MOTRIN) 200 MG tablet Take 200 mg by mouth every 8 (eight) hours as  needed. Reported on 08/27/2015   Yes Historical Provider, MD   BP 117/81 mmHg  Pulse 69  Temp(Src) 99.3 F (37.4 C) (Oral)  Resp 20  SpO2 95% Physical Exam  Constitutional: He is oriented to person, place, and time. He appears well-developed and well-nourished.  Uncomfortable but in NAD.  HENT:  Head: Normocephalic and atraumatic.  Left Ear: External ear normal.  Left tonsil erythematous and grossly swollen. Uvula midline. Could not visualize right TM due to cerumen burden. Swollen and erythematous nasal turbinates. No trismus.   Eyes: Conjunctivae and EOM are normal. Pupils are equal, round, and reactive to light.  Neck: Normal range of motion. Neck supple.  Submandibular lymphadenopathy.   Cardiovascular: Normal rate, regular rhythm, normal heart sounds and intact distal pulses.   No murmur heard. Pulmonary/Chest: Effort normal and breath sounds normal. No respiratory distress. He has no wheezes. He has no rales.  Abdominal: Soft. Bowel sounds are normal. He exhibits no distension. There is no tenderness. There is no rebound and no guarding.  Musculoskeletal: Normal range of motion. He exhibits no edema.  Lymphadenopathy:    He has cervical adenopathy.  Neurological: He is alert and oriented to person, place, and time.  Skin: Skin is warm and dry. No rash noted.  Nursing note and vitals reviewed.   ED Course  Procedures (including critical care time) Labs Review Labs Reviewed - No data to display  Imaging Review No results found. I have personally reviewed and evaluated these images and lab results as part of my medical decision-making.   EKG Interpretation None      MDM   Final diagnoses:  Tonsillar abscess   Pt presents with  worsening sore throat. Physical exam consistent with L peritonsillar abscess.  On-call Otolaryngologist Dr. Flo ShanksKarol Wolicki was consulted. He recommended drainage of abscess, and Dr. Lazarus SalinesWolicki said he could perform the procedure this morning in  his office. No imaging needed at this time. Patient was discharged to have PTA drained outpatient with ENT.  Dani GobbleHillary Josephus Harriger, MD [facility], PGY-2  [facility] Moen Zamzam Whinery, MD 08/31/15 [phone]  Rolland PorterMark James, MD 09/08/15 [phone]

## 2015-08-30 NOTE — ED Notes (Signed)
Provider at the bedside.  

## 2016-01-18 ENCOUNTER — Encounter (HOSPITAL_COMMUNITY): Payer: Self-pay | Admitting: Emergency Medicine

## 2016-01-18 ENCOUNTER — Emergency Department (HOSPITAL_COMMUNITY)
Admission: EM | Admit: 2016-01-18 | Discharge: 2016-01-18 | Disposition: A | Discharge status: Home / Self Care | Payer: Self-pay | Attending: Emergency Medicine | Admitting: Emergency Medicine

## 2016-01-18 DIAGNOSIS — J020 Streptococcal pharyngitis: Secondary | ICD-10-CM | POA: Insufficient documentation

## 2016-01-18 DIAGNOSIS — F17200 Nicotine dependence, unspecified, uncomplicated: Secondary | ICD-10-CM | POA: Insufficient documentation

## 2016-01-18 LAB — RAPID STREP SCREEN (MED CTR MEBANE ONLY): STREPTOCOCCUS, GROUP A SCREEN (DIRECT): POSITIVE — AB

## 2016-01-18 MED ORDER — PENICILLIN G BENZATHINE 1200000 UNIT/2ML IM SUSP
1.2000 10*6.[IU] | Freq: Once | INTRAMUSCULAR | Status: AC
  Administered 2016-01-18: 1.2 10*6.[IU] via INTRAMUSCULAR
  Filled 2016-01-18: qty 2

## 2016-01-18 NOTE — ED Provider Notes (Signed)
[facility]-EMERGENCY DEPT Provider Note   CSN: [id number] Arrival date & time: 01/18/16  [id number]   By signing my name below, I, Clovis PuAvnee Patel, attest that this documentation has been prepared under the direction and in the presence of  Arthor CaptainAbigail Shondrika Hoque, PA-C. Electronically Signed: Clovis PuAvnee Patel, ED Scribe. 01/18/16. 9:50 AM.   History   Chief Complaint Chief Complaint  Patient presents with  . Sore Throat    The history is provided by the patient. No language interpreter was used.   HPI Comments:  Travis Stevens is a 50 y.o. male who presents to the Emergency Department complaining of sudden onset, worsening sore throat x 2 days. He notes associated subjective fever. Pt states he gets strep throat every year. He has taken Motrin, last does at 7:45 AM today, with little relief. Pt denies cough, rhinorrhea, any other associated symptoms and modifying factors at this time.   History reviewed. No pertinent past medical history.  There are no active problems to display for this patient.   History reviewed. No pertinent surgical history.   Home Medications    Prior to Admission medications   Medication Sig Start Date End Date Taking? Authorizing Provider  ibuprofen (ADVIL,MOTRIN) 200 MG tablet Take 200 mg by mouth every 8 (eight) hours as needed. Reported on 08/27/2015    Historical Provider, MD    Family History No family history on file.  Social History Social History  Substance Use Topics  . Smoking status: Current Every Day Smoker  . Smokeless tobacco: Not on file  . Alcohol use Yes     Allergies   Patient has no known allergies.   Review of Systems Review of Systems  Constitutional: Positive for fever (subjective).  HENT: Positive for sore throat. Negative for rhinorrhea.   Respiratory: Negative for cough.     Physical Exam Updated Vital Signs BP 149/78 (BP Location: Right Arm)   Pulse 102   Temp 99 F (37.2 C) (Oral)   Resp 16   SpO2 99%   Physical Exam    Constitutional: He is oriented to person, place, and time. He appears well-developed and well-nourished. No distress.  HENT:  Head: Normocephalic and atraumatic.  Mouth/Throat: Tonsillar exudate.  Significant tonsillar erythema and hypertrophy. Mild exudate on the tonsil.  Eyes: Conjunctivae are normal.  Cardiovascular: Normal rate.   Pulmonary/Chest: Effort normal.  Abdominal: He exhibits no distension.  Lymphadenopathy:       Head (right side): Tonsillar adenopathy present.       Head (left side): Tonsillar adenopathy present.  Neurological: He is alert and oriented to person, place, and time.  Skin: Skin is warm and dry.  Psychiatric: He has a normal mood and affect.  Nursing note and vitals reviewed.   ED Treatments / Results  DIAGNOSTIC STUDIES:  Oxygen Saturation is 99% on RA, normal by my interpretation.    COORDINATION OF CARE:  9:49 AM Discussed treatment plan with pt at bedside and pt agreed to plan.  Labs (all labs ordered are listed, but only abnormal results are displayed) Labs Reviewed - No data to display  EKG  EKG Interpretation None       Radiology No results found.  Procedures Procedures (including critical care time)  Medications Ordered in ED Medications - No data to display   Initial Impression / Assessment and Plan / ED Course  I have reviewed the triage vital signs and the nursing notes.  Pertinent labs & imaging results that were available during my care  of the patient were reviewed by me and considered in my medical decision making (see chart for details).  Clinical Course     Pt rapid strep test positive. Pt is tolerating secretions. Presentation not concerning for peritonsillar abscess or spread of infection to deep spaces of the throat; patent airway. Pt  Treated with IM PCN 1.2 M units. Marland Kitchen.Specific return precautions discussed. Recommended PCP follow up. Pt appears safe for discharge.    Final Clinical Impressions(s) / ED  Diagnoses   Final diagnoses:   strep pharyngitis    New Prescriptions New Prescriptions   No medications on file  I personally performed the services described in this documentation, which was scribed in my presence. The recorded information has been reviewed and is accurate.        Arthor Captainbigail Vanisha Whiten, PA-C 01/18/16 1014    Lavera Guiseana Duo Liu, MD 01/18/16 [phone]

## 2016-01-18 NOTE — Discharge Instructions (Signed)
Get help right away if: °You have new symptoms, such as vomiting, severe headache, stiff or painful neck, chest pain, or shortness of breath. °You have severe throat pain, drooling, or changes in your voice. °You have swelling of the neck, or the skin on the neck becomes red and tender. °You have signs of dehydration, such as fatigue, dry mouth, and decreased urination. °You become increasingly sleepy, or you cannot wake up completely. °Your joints become red or painful. °
